# Patient Record
Sex: Male | Born: 1990 | Hispanic: Yes | Marital: Single
Health system: Southern US, Community
[De-identification: ages and names within clinical notes are randomized; demographics above are authoritative.]

---

## 2018-03-27 ENCOUNTER — Emergency Department: Payer: Self-pay

## 2018-03-27 ENCOUNTER — Other Ambulatory Visit: Payer: Self-pay

## 2018-03-27 ENCOUNTER — Emergency Department
Admission: EM | Admit: 2018-03-27 | Discharge: 2018-03-27 | Disposition: A | Discharge status: Home / Self Care | Payer: Self-pay | Attending: Emergency Medicine | Admitting: Emergency Medicine

## 2018-03-27 DIAGNOSIS — T507X1A Poisoning by analeptics and opioid receptor antagonists, accidental (unintentional), initial encounter: Secondary | ICD-10-CM | POA: Insufficient documentation

## 2018-03-27 DIAGNOSIS — T40601A Poisoning by unspecified narcotics, accidental (unintentional), initial encounter: Secondary | ICD-10-CM

## 2018-03-27 LAB — CBC WITH DIFFERENTIAL/PLATELET
ABS IMMATURE GRANULOCYTES: 0.18 10*3/uL — AB (ref 0.00–0.07)
Basophils Absolute: 0 10*3/uL (ref 0.0–0.1)
Basophils Relative: 0 %
Eosinophils Absolute: 0 10*3/uL (ref 0.0–0.5)
Eosinophils Relative: 0 %
HEMATOCRIT: 45.6 % (ref 39.0–52.0)
HEMOGLOBIN: 15.3 g/dL (ref 13.0–17.0)
Immature Granulocytes: 1 %
LYMPHS ABS: 1 10*3/uL (ref 0.7–4.0)
LYMPHS PCT: 6 %
MCH: 29 pg (ref 26.0–34.0)
MCHC: 33.6 g/dL (ref 30.0–36.0)
MCV: 86.5 fL (ref 80.0–100.0)
MONO ABS: 1.6 10*3/uL — AB (ref 0.1–1.0)
MONOS PCT: 10 %
NEUTROS ABS: 12.9 10*3/uL — AB (ref 1.7–7.7)
Neutrophils Relative %: 83 %
Platelets: 214 10*3/uL (ref 150–400)
RBC: 5.27 MIL/uL (ref 4.22–5.81)
RDW: 13.2 % (ref 11.5–15.5)
WBC: 15.7 10*3/uL — AB (ref 4.0–10.5)
nRBC: 0 % (ref 0.0–0.2)

## 2018-03-27 LAB — COMPREHENSIVE METABOLIC PANEL
ALBUMIN: 4.7 g/dL (ref 3.5–5.0)
ALK PHOS: 109 U/L (ref 38–126)
ALT: 32 U/L (ref 0–44)
AST: 30 U/L (ref 15–41)
Anion gap: 8 (ref 5–15)
BILIRUBIN TOTAL: 0.5 mg/dL (ref 0.3–1.2)
BUN: 15 mg/dL (ref 6–20)
CALCIUM: 8.8 mg/dL — AB (ref 8.9–10.3)
CO2: 27 mmol/L (ref 22–32)
CREATININE: 1.01 mg/dL (ref 0.61–1.24)
Chloride: 104 mmol/L (ref 98–111)
GFR calc Af Amer: >60 mL/min (ref >60)
GFR calc non Af Amer: >60 mL/min (ref >60)
GLUCOSE: 193 mg/dL — AB (ref 70–99)
Potassium: 4.1 mmol/L (ref 3.5–5.1)
SODIUM: 139 mmol/L (ref 135–145)
Total Protein: 8 g/dL (ref 6.5–8.1)

## 2018-03-27 LAB — ACETAMINOPHEN LEVEL: Acetaminophen (Tylenol), Serum: <10 ug/mL — ABNORMAL LOW (ref 10–30)

## 2018-03-27 LAB — SALICYLATE LEVEL: Salicylate Lvl: <7 mg/dL (ref 2.8–30.0)

## 2018-03-27 LAB — TROPONIN I: TROPONIN I: 0.06 ng/mL — AB (ref <0.03)

## 2018-03-27 LAB — ETHANOL: Alcohol, Ethyl (B): <10 mg/dL (ref <10)

## 2018-03-27 MED ORDER — NALOXONE HCL 4 MG/0.1ML NA LIQD
1.0000 | Freq: Once | NASAL | Status: AC
  Administered 2018-03-27: 1 via NASAL
  Filled 2018-03-27: qty 4

## 2018-03-27 MED ORDER — SODIUM CHLORIDE 0.9 % IV BOLUS
1000.0000 mL | Freq: Once | INTRAVENOUS | Status: AC
  Administered 2018-03-27: 1000 mL via INTRAVENOUS

## 2018-03-27 NOTE — ED Notes (Signed)
Family member at bedside. Respirations even/unlabored. Nadn.

## 2018-03-27 NOTE — ED Notes (Signed)
Instructions given in regards to at home narcan usage. Patient and his significant other verbalize understanding.

## 2018-03-27 NOTE — ED Triage Notes (Addendum)
Pt was found as an overdose on heroin. EMS stated that he was found on the floor unresponsive. Pt received 4mg  of narcan nasally and did not respond to and ems gave 2 mg IV and pt became more alert. Pt is A&OX3 on arrival to ED. Pt is c/o chest discomfort.

## 2018-03-27 NOTE — ED Notes (Signed)
Pt is A&Ox3. Pt does not seem to want to answer questions willingly. Pt c/o chest discomfort at this time. No other S/S

## 2018-03-27 NOTE — Discharge Instructions (Addendum)
Please make an appointment to follow-up at [facility] to discuss how we can help you with your addiction.  Otherwise follow-up with primary care and return to the emergency department for any concerns.  It was a pleasure to take care of you today, and thank you for coming to our emergency department.  If you have any questions or concerns before leaving please ask the nurse to grab me and I'm more than happy to go through your aftercare instructions again.  If you have any concerns once you are home that you are not improving or are in fact getting worse before you can make it to your follow-up appointment, please do not hesitate to call 911 and come back for further evaluation.  Merrily Brittle, MD  Results for orders placed or performed during the hospital encounter of 03/27/18  Acetaminophen level  Result Value Ref Range   Acetaminophen (Tylenol), Serum <10 (L) 10 - 30 ug/mL  Comprehensive metabolic panel  Result Value Ref Range   Sodium 139 135 - 145 mmol/L   Potassium 4.1 3.5 - 5.1 mmol/L   Chloride 104 98 - 111 mmol/L   CO2 27 22 - 32 mmol/L   Glucose, Bld 193 (H) 70 - 99 mg/dL   BUN 15 6 - 20 mg/dL   Creatinine, Ser 1.61 0.61 - 1.24 mg/dL   Calcium 8.8 (L) 8.9 - 10.3 mg/dL   Total Protein 8.0 6.5 - 8.1 g/dL   Albumin 4.7 3.5 - 5.0 g/dL   AST 30 15 - 41 U/L   ALT 32 0 - 44 U/L   Alkaline Phosphatase 109 38 - 126 U/L   Total Bilirubin 0.5 0.3 - 1.2 mg/dL   GFR calc non Af Amer >60 >60 mL/min   GFR calc Af Amer >60 >60 mL/min   Anion gap 8 5 - 15  Ethanol  Result Value Ref Range   Alcohol, Ethyl (B) <10 <10 mg/dL  Salicylate level  Result Value Ref Range   Salicylate Lvl <7.0 2.8 - 30.0 mg/dL  CBC with Differential  Result Value Ref Range   WBC 15.7 (H) 4.0 - 10.5 K/uL   RBC 5.27 4.22 - 5.81 MIL/uL   Hemoglobin 15.3 13.0 - 17.0 g/dL   HCT 09.6 04.5 - 40.9 %   MCV 86.5 80.0 - 100.0 fL   MCH 29.0 26.0 - 34.0 pg   MCHC 33.6 30.0 - 36.0 g/dL   RDW 81.1 91.4 - 78.2 %   Platelets 214 150 - 400 K/uL   nRBC 0.0 0.0 - 0.2 %   Neutrophils Relative % 83 %   Neutro Abs 12.9 (H) 1.7 - 7.7 K/uL   Lymphocytes Relative 6 %   Lymphs Abs 1.0 0.7 - 4.0 K/uL   Monocytes Relative 10 %   Monocytes Absolute 1.6 (H) 0.1 - 1.0 K/uL   Eosinophils Relative 0 %   Eosinophils Absolute 0.0 0.0 - 0.5 K/uL   Basophils Relative 0 %   Basophils Absolute 0.0 0.0 - 0.1 K/uL   Immature Granulocytes 1 %   Abs Immature Granulocytes 0.18 (H) 0.00 - 0.07 K/uL  Troponin I  Result Value Ref Range   Troponin I 0.06 (HH) <0.03 ng/mL   Dg Chest Port 1 View  Result Date: 03/27/2018 CLINICAL DATA:  Heroin overdose status post Narcan. EXAM: PORTABLE CHEST 1 VIEW COMPARISON:  None. FINDINGS: The cardiomediastinal contours are normal. The lungs are clear. Pulmonary vasculature is normal. No consolidation, pleural effusion, or pneumothorax. No  acute osseous abnormalities are seen. IMPRESSION: Negative AP of the chest. Electronically Signed   By: Narda Rutherford M.D.   On: 03/27/2018 01:44

## 2018-03-27 NOTE — ED Provider Notes (Signed)
[facility] Emergency Department Provider Note  ____________________________________________   First MD Initiated Contact with Patient 03/27/18 [phone]     (approximate)  I have reviewed the triage vital signs and the nursing notes.   HISTORY  Chief Complaint Drug Overdose  Level 5 exemption history limited by the patient's clinical condition  HPI Neil Craig is a 27 y.o. male who comes to the emergency department via EMS after an unintentional drug overdose.  History is challenging to obtain however according to EMS the patient reported insufflating "cocaine" with several friends this evening.  Most of his friends went to sleep however at some point someone woke up and came out into the living room and noted that the patient and one other person were unconscious and called 911.  When EMS arrived the patient was blue, with pinpoint pupils, saturating 70%, and the began to assist ventilations.  They gave him 4 mg of intranasal naloxone then placed an IV and gave 2 mg IV and the patient immediately woke up.  He was initially agitated but calm down in transport.  The patient's friend died at the scene.  The patient himself denies IV drug use.  He denies intentionally using opioids.    No past medical history on file.  There are no active problems to display for this patient.     Prior to Admission medications   Not on File    Allergies Patient has no known allergies.  No family history on file.  Social History Social History   Tobacco Use  . Smoking status: Not on file  Substance Use Topics  . Alcohol use: Not on file  . Drug use: Not on file    Review of Systems  Level 5 exemption history limited by the patient's clinical condition ____________________________________________   PHYSICAL EXAM:  VITAL SIGNS: ED Triage Vitals  Enc Vitals Group     BP      Pulse      Resp      Temp      Temp src      SpO2      Weight    Height      Head Circumference      Peak Flow      Pain Score      Pain Loc      Pain Edu?      Excl. in GC?     Constitutional: Calm and cooperative appears somewhat "stunned" but no acute distress Eyes: PERRL EOMI. midrange and brisk Head: Atraumatic. Nose: No congestion/rhinnorhea. Mouth/Throat: No trismus Neck: No stridor.  Right external jugular IV in place Cardiovascular: Tachycardic rate, regular rhythm. Grossly normal heart sounds.  Good peripheral circulation. Respiratory: Normal respiratory effort.  No retractions. Lungs CTAB and moving good air Gastrointestinal: Soft nontender Musculoskeletal: No lower extremity edema   Neurologic:  Normal speech and language. No gross focal neurologic deficits are appreciated. Skin:  Skin is warm, dry and intact. No rash noted. Psychiatric: Somewhat sad affect    ____________________________________________   DIFFERENTIAL includes but not limited to  Intentional drug overdose, unintentional drug overdose, opioid overdose, cocaine overdose, heroine induced pulmonary edema ____________________________________________   LABS (all labs ordered are listed, but only abnormal results are displayed)  Labs Reviewed  ACETAMINOPHEN LEVEL - Abnormal; Notable for the following components:      Result Value   Acetaminophen (Tylenol), Serum <10 (*)    All other components within normal limits  COMPREHENSIVE METABOLIC PANEL -  Abnormal; Notable for the following components:   Glucose, Bld 193 (*)    Calcium 8.8 (*)    All other components within normal limits  CBC WITH DIFFERENTIAL/PLATELET - Abnormal; Notable for the following components:   WBC 15.7 (*)    Neutro Abs 12.9 (*)    Monocytes Absolute 1.6 (*)    Abs Immature Granulocytes 0.18 (*)    All other components within normal limits  TROPONIN I - Abnormal; Notable for the following components:   Troponin I 0.06 (*)    All other components within normal limits  ETHANOL  SALICYLATE  LEVEL    Lab work reviewed by me with slightly elevated troponin which is nonspecific and likely secondary to demand from profound hypoxia earlier __________________________________________  EKG    ____________________________________________  RADIOLOGY  Chest x-ray reviewed by me with no acute disease ____________________________________________   PROCEDURES  Procedure(s) performed: no  Procedures  Critical Care performed: no  ____________________________________________   INITIAL IMPRESSION / ASSESSMENT AND PLAN / ED COURSE  Pertinent labs & imaging results that were available during my care of the patient were reviewed by me and considered in my medical decision making (see chart for details).   As part of my medical decision making, I reviewed the following data within the electronic MEDICAL RECORD NUMBER History obtained from family if available, nursing notes, old chart and ekg, as well as notes from prior ED visits.  The patient comes to the emergency department after an unknown ingestion however bradypnea, pinpoint pupils, hypoxia are all consistent with opioid ingestion.  He did reversed with naloxone and is now behaving appropriately.  I spoke with [facility] control who agrees with labs including salicylate and acetaminophen and a chest x-ray in 6 hours of observation.  After 6 hours of observation the patient is now awake and appropriate.  I appreciate he had several episodes of bradypnea while here but I checked on him during those episodes and he was always sleeping and woke up and was breathing appropriately.  His sister is at bedside to take him home.  I have provided him with intranasal naloxone for home.  Strict return precautions have been given and I am referring him to [facility] to discuss as an outpatient.      ____________________________________________   FINAL CLINICAL IMPRESSION(S) / ED DIAGNOSES  Final diagnoses:  Opiate overdose, accidental or  unintentional, initial encounter (HCC)      NEW MEDICATIONS STARTED DURING THIS VISIT:  There are no discharge medications for this patient.    Note:  This document was prepared using Dragon voice recognition software and may include unintentional dictation errors.     Merrily Brittle, MD 03/27/18 2311

## 2018-03-27 NOTE — ED Notes (Signed)
Pt is resting in bed. Sister at bedside. NADN

## 2018-03-27 NOTE — ED Notes (Signed)
Pt has vomited on floor at this time. Provider made aware.

## 2020-05-16 IMAGING — DX DG CHEST 1V PORT
1 series · 1 of 1 positions shown · non-contrast
Comparison: None.

CLINICAL DATA: Heroin overdose status post Narcan.

EXAM:
PORTABLE CHEST 1 VIEW

[chest ap]
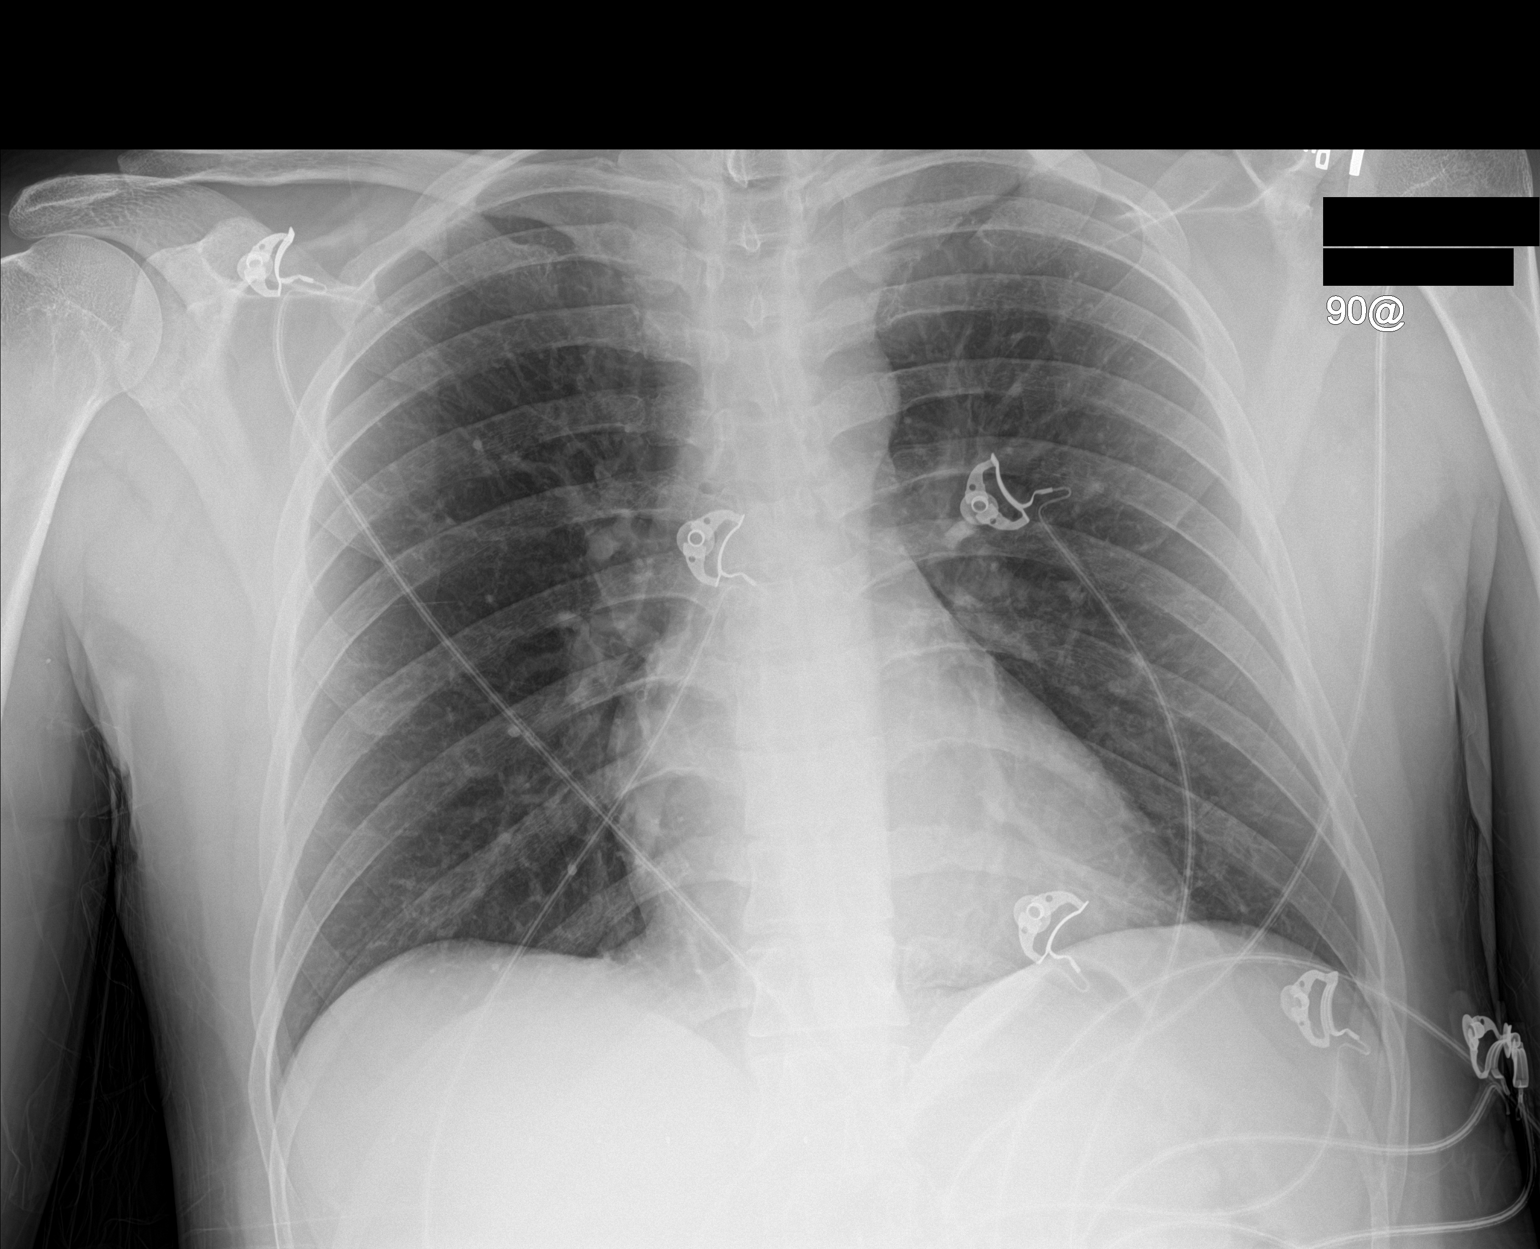

[1 of 1 positions shown; findings below may reference images not displayed]

FINDINGS: The cardiomediastinal contours are normal. The lungs are clear.
Pulmonary vasculature is normal. No consolidation, pleural effusion,
or pneumothorax. No acute osseous abnormalities are seen.
IMPRESSION: Negative AP of the chest.
